# Patient Record
Sex: Male | Born: 1985 | Race: Black or African American | Hispanic: No | Marital: Single
Health system: Southern US, Community
[De-identification: ages and names within clinical notes are randomized; demographics above are authoritative.]

---

## 2009-08-06 ENCOUNTER — Encounter (INDEPENDENT_AMBULATORY_CARE_PROVIDER_SITE_OTHER): Payer: Self-pay | Admitting: Family Medicine

## 2009-08-06 ENCOUNTER — Ambulatory Visit: Payer: Self-pay | Admitting: Internal Medicine

## 2009-08-06 LAB — CONVERTED CEMR LAB
ALT: 15 units/L (ref 0–53)
AST: 21 units/L (ref 0–37)
Albumin: 4.6 g/dL (ref 3.5–5.2)
Calcium: 9.5 mg/dL (ref 8.4–10.5)
Chloride: 105 meq/L (ref 96–112)
Eosinophils Relative: 4 % (ref 0–5)
HCT: 47.7 % (ref 39.0–52.0)
Lymphocytes Relative: 42 % (ref 12–46)
Lymphs Abs: 2.1 10*3/uL (ref 0.7–4.0)
Neutrophils Relative %: 47 % (ref 43–77)
Platelets: 200 10*3/uL (ref 150–400)
Potassium: 4.3 meq/L (ref 3.5–5.3)
Total Protein: 7.3 g/dL (ref 6.0–8.3)
WBC: 5.2 10*3/uL (ref 4.0–10.5)

## 2009-11-15 ENCOUNTER — Emergency Department (HOSPITAL_COMMUNITY): Admission: EM | Admit: 2009-11-15 | Discharge: 2009-11-15 | Payer: Self-pay | Admitting: Family Medicine

## 2010-07-19 LAB — POCT URINALYSIS DIP (DEVICE)
Glucose, UA: NEGATIVE mg/dL
Ketones, ur: NEGATIVE mg/dL
Specific Gravity, Urine: 1.02 (ref 1.005–1.030)
Urobilinogen, UA: 0.2 mg/dL (ref 0.0–1.0)

## 2010-07-19 LAB — POCT I-STAT, CHEM 8
BUN: 9 mg/dL (ref 6–23)
Creatinine, Ser: 1 mg/dL (ref 0.4–1.5)
Hemoglobin: 17 g/dL (ref 13.0–17.0)
Potassium: 3.6 mEq/L (ref 3.5–5.1)
Sodium: 141 mEq/L (ref 135–145)

## 2010-12-08 ENCOUNTER — Emergency Department (HOSPITAL_COMMUNITY)
Admission: EM | Admit: 2010-12-08 | Discharge: 2010-12-08 | Disposition: A | Discharge status: Home / Self Care | Payer: No Typology Code available for payment source | Attending: Emergency Medicine | Admitting: Emergency Medicine

## 2010-12-08 ENCOUNTER — Emergency Department (HOSPITAL_COMMUNITY): Payer: Self-pay

## 2010-12-08 DIAGNOSIS — M25539 Pain in unspecified wrist: Secondary | ICD-10-CM | POA: Insufficient documentation
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
## 2015-02-26 ENCOUNTER — Ambulatory Visit (HOSPITAL_COMMUNITY)
Admission: RE | Admit: 2015-02-26 | Discharge: 2015-02-26 | Disposition: A | Discharge status: Home / Self Care | Payer: 59 | Source: Ambulatory Visit | Attending: Nurse Practitioner | Admitting: Nurse Practitioner

## 2015-02-26 DIAGNOSIS — Z1379 Encounter for other screening for genetic and chromosomal anomalies: Secondary | ICD-10-CM | POA: Insufficient documentation

## 2015-03-08 ENCOUNTER — Telehealth (HOSPITAL_COMMUNITY): Payer: Self-pay | Admitting: MS"

## 2015-03-08 NOTE — Telephone Encounter (Signed)
Called Ms. Cory Schmitt regarding cystic fibrosis carrier screening results for the father of the pregnancy, Trellis Paganiniric Titus. Carrier screening for cystic fibrosis yielded a normal/negative result for the 600 disease-causing mutations screened, meaning that the risk to be a CF carrier for him can be reduced from ~1 in 61 to approximately 1 in 335.  Therefore, the risk for cystic fibrosis in her current pregnancy is reduced to 1 in 1340. The patient understands that CF carrier screening can reduce but not eliminate the chance to be a CF carrier.   [name] 03/08/2015 9:21 AM

## 2015-03-19 ENCOUNTER — Encounter (HOSPITAL_COMMUNITY): Payer: Self-pay
